# Patient Record
Sex: Male | Born: 2000 | Race: Black or African American | Hispanic: No | Marital: Single | Smoking: Never smoker
Health system: Southern US, Community
[De-identification: ages and names within clinical notes are randomized; demographics above are authoritative.]

## PROBLEM LIST (undated history)

## (undated) DIAGNOSIS — T1490XA Injury, unspecified, initial encounter: Secondary | ICD-10-CM

## (undated) HISTORY — PX: TONSILLECTOMY: SUR1361

---
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
## 2022-02-09 DIAGNOSIS — J302 Other seasonal allergic rhinitis: Secondary | ICD-10-CM | POA: Insufficient documentation

## 2022-02-09 DIAGNOSIS — Z91018 Allergy to other foods: Secondary | ICD-10-CM | POA: Insufficient documentation

## 2022-08-21 ENCOUNTER — Other Ambulatory Visit: Payer: Self-pay

## 2022-08-21 ENCOUNTER — Encounter: Payer: Self-pay | Admitting: Emergency Medicine

## 2022-08-21 ENCOUNTER — Ambulatory Visit (INDEPENDENT_AMBULATORY_CARE_PROVIDER_SITE_OTHER): Payer: Medicaid Other

## 2022-08-21 ENCOUNTER — Ambulatory Visit
Admission: EM | Admit: 2022-08-21 | Discharge: 2022-08-21 | Disposition: A | Discharge status: Home / Self Care | Payer: Medicaid Other | Attending: Internal Medicine | Admitting: Internal Medicine

## 2022-08-21 DIAGNOSIS — Z20822 Contact with and (suspected) exposure to covid-19: Secondary | ICD-10-CM

## 2022-08-21 DIAGNOSIS — J111 Influenza due to unidentified influenza virus with other respiratory manifestations: Secondary | ICD-10-CM

## 2022-08-21 DIAGNOSIS — J4521 Mild intermittent asthma with (acute) exacerbation: Secondary | ICD-10-CM

## 2022-08-21 DIAGNOSIS — R0789 Other chest pain: Secondary | ICD-10-CM | POA: Diagnosis not present

## 2022-08-21 DIAGNOSIS — R000 Tachycardia, unspecified: Secondary | ICD-10-CM

## 2022-08-21 MED ORDER — ALBUTEROL SULFATE HFA 108 (90 BASE) MCG/ACT IN AERS: 1.0000 | INHALATION_SPRAY | Freq: Four times a day (QID) | RESPIRATORY_TRACT | 0 refills | Status: AC | PRN

## 2022-08-21 MED ORDER — PREDNISONE 10 MG (21) PO TBPK: ORAL_TABLET | Freq: Every day | ORAL | 0 refills | Status: AC

## 2022-08-21 MED ORDER — SODIUM CHLORIDE 0.9 % IV BOLUS
1000.0000 mL | Freq: Once | INTRAVENOUS | Status: AC
  Administered 2022-08-21: 1000 mL via INTRAVENOUS

## 2022-08-21 MED ORDER — ACETAMINOPHEN 325 MG PO TABS
650.0000 mg | ORAL_TABLET | Freq: Once | ORAL | Status: AC
  Administered 2022-08-21: 650 mg via ORAL

## 2022-08-21 MED ORDER — OSELTAMIVIR PHOSPHATE 75 MG PO CAPS: 75.0000 mg | ORAL_CAPSULE | Freq: Two times a day (BID) | ORAL | 0 refills | Status: AC

## 2022-08-21 NOTE — ED Triage Notes (Signed)
Pt said last night he began to vomit and shiver. Pt said he has generalized body aches. Not wanting to eat or drink. Very lethargic. Low grade temp 99.5. Pt said his arms and face are tingling.

## 2022-08-21 NOTE — Discharge Instructions (Signed)
I have prescribed you Tamiflu, prednisone, and refilled your albuterol inhaler.  Please start taking these immediately.  Ensure adequate fluid hydration with clear oral fluids including water, Gatorade, or Pedialyte.  I recommend that you get a pulse oximeter from the pharmacy when you pick up your medications and monitor your oxygen and heart rate very diligently.  If your oxygen continues to be below 93% consistently or if your heart rate continues above 120s consistently, please go straight to the emergency department.  If you start to feel worse, please go straight to the emergency department.  Blood work and COVID test are pending.  We will call if they are abnormal.

## 2022-08-21 NOTE — ED Provider Notes (Addendum)
[facility]    CSN: [id number] Arrival date & time: 08/21/22  1147      History   Chief Complaint Chief Complaint  Patient presents with   Fever   Emesis   Generalized Body Aches    HPI Charles Franklin is a 22 y.o. male.   Patient presents with nausea, vomiting, generalized bodyaches, chills, nasal congestion, cough that started last night.  Also reports that his hands are tingly.  Denies chest pain, shortness of breath, diarrhea, abdominal pain.  Denies any sick contacts or fever at home.  Patient reports history of asthma but has not used albuterol inhaler since symptoms started.   Fever Emesis   Past Medical History:  Diagnosis Date   Bone injury     Patient Active Problem List   Diagnosis Date Noted   Moderate persistent asthma without complication 03/10/2018   Severe persistent asthma 04/14/2015   Allergic rhinitis 04/14/2015   Anaphylactic shock due to adverse food reaction 04/14/2015    Past Surgical History:  Procedure Laterality Date   TONSILLECTOMY     WISDOM TOOTH EXTRACTION  02/2016       Home Medications    Prior to Admission medications   Medication Sig Start Date End Date Taking? Authorizing Provider  albuterol (VENTOLIN HFA) 108 (90 Base) MCG/ACT inhaler Inhale 1-2 puffs into the lungs every 6 (six) hours as needed for wheezing or shortness of breath. 08/21/22  Yes [place] Labo, Acie Fredrickson, FNP  oseltamivir (TAMIFLU) 75 MG capsule Take 1 capsule (75 mg total) by mouth every 12 (twelve) hours. 08/21/22  Yes Tanya Marvin, Rolly Salter E, FNP  predniSONE (STERAPRED UNI-PAK 21 TAB) 10 MG (21) TBPK tablet Take by mouth daily. Take 6 tabs by mouth daily  for 2 days, then 5 tabs for 2 days, then 4 tabs for 2 days, then 3 tabs for 2 days, 2 tabs for 2 days, then 1 tab by mouth daily for 2 days 08/21/22  Yes Shantale Holtmeyer, Acie Fredrickson, FNP  albuterol (PROAIR HFA) 108 (90 Base) MCG/ACT inhaler Inhale 2 puffs into the lungs every 4 (four) hours as needed for wheezing or shortness  of breath. 03/10/18   Ambs, Norvel Richards, FNP  cetirizine (ZYRTEC) 10 MG tablet Take 1 tablet daily if needed for runny nose or itching. 03/10/18   Ambs, Norvel Richards, FNP  EPINEPHrine (EPIPEN 2-PAK) 0.3 mg/0.3 mL IJ SOAJ injection Use as directed for severe allergic reaction 03/10/18   Ambs, Norvel Richards, FNP  fluticasone (FLONASE) 50 MCG/ACT nasal spray 2 sprays per nostril once a day as needed for stuffy nose. 03/10/18   Hetty Blend, FNP  fluticasone (FLOVENT HFA) 110 MCG/ACT inhaler 2 puffs twice daily to prevent coughing or wheezing. 03/10/18   Hetty Blend, FNP  hydrocortisone 2.5 % lotion Apply topically as needed. Patient not taking: Reported on 03/10/2018 09/10/16   Bobbitt, Heywood Iles, MD  omalizumab Geoffry Paradise) 150 MG injection INJECT 375MG  SUBCUTANEOUSLY EVERY 2 WEEKS. 06/03/18   06/05/18, MD  Water For Injection Sterile (STERILE WATER, PRESERVATIVE FREE,) injection Use as directed for reconstitution 06/03/18   06/05/18, MD    Family History History reviewed. No pertinent family history.  Social History Social History   Tobacco Use   Smoking status: Never   Smokeless tobacco: Never  Vaping Use   Vaping Use: Never used  Substance Use Topics   Alcohol use: No   Drug use: No     Allergies   Fish allergy, Peanut-containing drug  products, Peanuts [peanut oil], and Shellfish allergy   Review of Systems Review of Systems Per HPI  Physical Exam Triage Vital Signs ED Triage Vitals  Enc Vitals Group     BP 08/21/22 1247 102/63     Pulse Rate 08/21/22 1247 (!) 125     Resp 08/21/22 1247 (!) 22     Temp 08/21/22 1247 99.5 F (37.5 C)     Temp src --      SpO2 08/21/22 1247 98 %     Weight --      Height --      Head Circumference --      Peak Flow --      Pain Score 08/21/22 1245 8     Pain Loc --      Pain Edu? --      Excl. in [place]? --    No data found.  Updated Vital Signs BP 102/63 (BP Location: Left Arm)   Pulse (!) 136   Temp 99.1 F (37.3 C) (Oral)   Resp  (!) 22   SpO2 98%   Visual Acuity Right Eye Distance:   Left Eye Distance:   Bilateral Distance:    Right Eye Near:   Left Eye Near:    Bilateral Near:     Physical Exam Constitutional:      General: He is not in acute distress.    Appearance: Normal appearance. He is ill-appearing. He is not toxic-appearing or diaphoretic.  HENT:     Head: Normocephalic and atraumatic.     Right Ear: Tympanic membrane and ear canal normal.     Left Ear: Tympanic membrane and ear canal normal.     Nose: Congestion present.     Mouth/Throat:     Mouth: Mucous membranes are moist.     Pharynx: No posterior oropharyngeal erythema.  Eyes:     Extraocular Movements: Extraocular movements intact.     Conjunctiva/sclera: Conjunctivae normal.     Pupils: Pupils are equal, round, and reactive to light.  Cardiovascular:     Rate and Rhythm: Regular rhythm. Tachycardia present.     Pulses: Normal pulses.     Heart sounds: Normal heart sounds.  Pulmonary:     Effort: Pulmonary effort is normal. No respiratory distress.     Breath sounds: No stridor. Wheezing present. No rhonchi or rales.     Comments: Mild wheezing bilaterally. Abdominal:     General: Abdomen is flat. Bowel sounds are normal. There is no distension.     Palpations: Abdomen is soft.     Tenderness: There is no abdominal tenderness.  Musculoskeletal:        General: Normal range of motion.     Cervical back: Normal range of motion.  Skin:    General: Skin is warm and dry.  Neurological:     General: No focal deficit present.     Mental Status: He is alert and oriented to person, place, and time. Mental status is at baseline.     Cranial Nerves: Cranial nerves 2-12 are intact.     Sensory: Sensation is intact.     Motor: Motor function is intact.     Coordination: Coordination is intact.     Gait: Gait is intact.  Psychiatric:        Mood and Affect: Mood normal.        Behavior: Behavior normal.      UC Treatments /  Results  Labs (all labs ordered are listed,  but only abnormal results are displayed) Labs Reviewed  SARS CORONAVIRUS 2 (TAT 6-24 HRS)  CBC  BASIC METABOLIC PANEL    EKG   Radiology DG Chest 2 View  Result Date: 08/21/2022 CLINICAL DATA:  Chest discomfort. EXAM: CHEST - 2 VIEW COMPARISON:  June 20, 2009. FINDINGS: The heart size and mediastinal contours are within normal limits. Both lungs are clear. The visualized skeletal structures are unremarkable. IMPRESSION: No active cardiopulmonary disease. Electronically Signed   By: Marijo Conception M.D.   On: 08/21/2022 13:13    Procedures Procedures (including critical care time)  Medications Ordered in UC Medications  acetaminophen (TYLENOL) tablet 650 mg (650 mg Oral Given 08/21/22 1317)  sodium chloride 0.9 % bolus 1,000 mL (0 mLs Intravenous Stopped 08/21/22 1544)    Initial Impression / Assessment and Plan / UC Course  I have reviewed the triage vital signs and the nursing notes.  Pertinent labs & imaging results that were available during my care of the patient were reviewed by me and considered in my medical decision making (see chart for details).     Due to appearance on physical exam and tachycardia, patient was advised that he will need to go to the hospital for further evaluation and management given limited resources here in urgent care.  Patient was adamant that he did not want to go to the hospital.  Discussed risk of not going to the hospital with patient.  Patient voiced understanding and accepted risks.  Given patient is refusing going to the hospital, will perform limited evaluation here in urgent care.  Tylenol administered in urgent care to see if fever and heart rate would decrease.  There was no change.  Chest x-ray was completed that was negative for any acute cardiopulmonary process.  Given heart rate did not improve with Tylenol administration, 1 bolus of IV fluids was administered.  Heart rate did not improve and  sustained at 130s.  Also, oxygen was 98% on initial triage and arrival to the urgent care and decreased to 90 to 93% while in urgent care.  Although, patient was lying flat for most of the visit which could be contributing.  When he sits up, oxygen will increase.  Suspect this is due to a mild wheezing noted on exam.  Albuterol deferred given significant tachycardia and concern for heart rate instability in urgent care.  No acute tachypnea noted.  Patient was again advised that he will need to go to the hospital for further evaluation and management given no improvement of symptoms with several different interventions and oxygen decreasing.  He was still adamant that he did not want to go to the hospital.  Risks again were discussed with patient about going to the hospital.  He voiced understanding and accepted risks.  Given the patient does not want a go to the hospital, will provide limited treatment here in urgent care.  I am highly suspicious the patient has influenza but did not have the ability to test for flu.  COVID test is pending.  Will opt to treat with Tamiflu.  It also appears the patient could be having an asthma exacerbation so prednisone steroid taper was prescribed.  He reports that he has taken this before and tolerated well.  Patient advised to push fluids and rest.  He was advised to get a pulse oximeter from the pharmacy and monitor heart rate and oxygen.  He was advised to go straight to the ER if symptoms persist or worsen or  if his oxygen or heart rate continue to be abnormal.  He states that he feels much better since arrival to the urgent care which is reassuring.  Advised patient it will be beneficial to come back to urgent care tomorrow to have recheck of vitals. He was agreeable. Patient was given strict return and ER precautions.  Patient verbalized understanding and was agreeable with plan. Oxygen increased to 95 and sustained prior to discharge. No other adventitous lung sounds  noted.   Coding this is a level 4 given multiple interventions, acuity of patient, and time spent with patient. Final Clinical Impressions(s) / UC Diagnoses   Final diagnoses:  Influenza  Encounter for laboratory testing for COVID-19 virus  Tachycardia  Mild intermittent asthma with acute exacerbation     Discharge Instructions      I have prescribed you Tamiflu, prednisone, and refilled your albuterol inhaler.  Please start taking these immediately.  Ensure adequate fluid hydration with clear oral fluids including water, Gatorade, or Pedialyte.  I recommend that you get a pulse oximeter from the pharmacy when you pick up your medications and monitor your oxygen and heart rate very diligently.  If your oxygen continues to be below 93% consistently or if your heart rate continues above 120s consistently, please go straight to the emergency department.  If you start to feel worse, please go straight to the emergency department.  Blood work and COVID test are pending.  We will call if they are abnormal.     ED Prescriptions     Medication Sig Dispense Auth. Provider   oseltamivir (TAMIFLU) 75 MG capsule Take 1 capsule (75 mg total) by mouth every 12 (twelve) hours. 10 capsule [place], [place] E, [place]   predniSONE (STERAPRED UNI-PAK 21 TAB) 10 MG (21) TBPK tablet Take by mouth daily. Take 6 tabs by mouth daily  for 2 days, then 5 tabs for 2 days, then 4 tabs for 2 days, then 3 tabs for 2 days, 2 tabs for 2 days, then 1 tab by mouth daily for 2 days 42 tablet Terianna Peggs, Hildred Alamin E, FNP   albuterol (VENTOLIN HFA) 108 (90 Base) MCG/ACT inhaler Inhale 1-2 puffs into the lungs every 6 (six) hours as needed for wheezing or shortness of breath. 1 each Teodora Medici, [place]      PDMP not reviewed this encounter.   Teodora Medici, [place] 08/21/22 [place], [place], [place] 08/21/22 [place], [place], FNP 08/21/22 [place], [place],  08/21/22 1651

## 2022-08-22 LAB — CBC

## 2022-08-22 LAB — SARS CORONAVIRUS 2 (TAT 6-24 HRS): SARS Coronavirus 2: NEGATIVE

## 2022-08-24 LAB — BASIC METABOLIC PANEL
BUN/Creatinine Ratio: 11 (ref 9–20)
BUN: 11 mg/dL (ref 6–20)
CO2: 18 mmol/L — ABNORMAL LOW (ref 20–29)
Calcium: 8.4 mg/dL — ABNORMAL LOW (ref 8.7–10.2)
Chloride: 106 mmol/L (ref 96–106)
Creatinine, Ser: 1.01 mg/dL (ref 0.76–1.27)
Glucose: 82 mg/dL (ref 70–99)
Potassium: 3.9 mmol/L (ref 3.5–5.2)
Sodium: 139 mmol/L (ref 134–144)
eGFR: 109 mL/min/{1.73_m2} (ref >59)

## 2022-09-17 ENCOUNTER — Telehealth: Payer: Self-pay | Admitting: Internal Medicine

## 2022-09-17 NOTE — Telephone Encounter (Signed)
Patient's CBC from visit from 08/21/2022 had never resulted in inbox.  Spoke with Valetta Fuller, RN who advised that it was originally canceled due to sample not being found but then was sent to [facility] to be ran.  I still never received the result.  Called [facility] today to inquire about result, and they faxed over result. Result in process to be scanned into chart.  There are no obvious significant abnormalities in blood work.  Patient was advised to follow-up with family doctor to have repeat blood work.  He advised that he does have a family doctor and will call them today to schedule an appointment.  All questions answered.

## 2022-11-22 DIAGNOSIS — J4520 Mild intermittent asthma, uncomplicated: Secondary | ICD-10-CM | POA: Insufficient documentation

## 2023-05-01 ENCOUNTER — Ambulatory Visit: Payer: Self-pay

## 2023-05-01 DIAGNOSIS — U071 COVID-19: Secondary | ICD-10-CM | POA: Insufficient documentation

## 2023-05-01 DIAGNOSIS — R062 Wheezing: Secondary | ICD-10-CM | POA: Insufficient documentation

## 2023-05-01 DIAGNOSIS — J029 Acute pharyngitis, unspecified: Secondary | ICD-10-CM | POA: Insufficient documentation

## 2023-11-17 ENCOUNTER — Emergency Department (HOSPITAL_BASED_OUTPATIENT_CLINIC_OR_DEPARTMENT_OTHER)
Admission: EM | Admit: 2023-11-17 | Discharge: 2023-11-18 | Disposition: A | Discharge status: Home / Self Care | Attending: Emergency Medicine | Admitting: Emergency Medicine

## 2023-11-17 ENCOUNTER — Encounter (HOSPITAL_BASED_OUTPATIENT_CLINIC_OR_DEPARTMENT_OTHER): Payer: Self-pay | Admitting: Emergency Medicine

## 2023-11-17 DIAGNOSIS — R112 Nausea with vomiting, unspecified: Secondary | ICD-10-CM | POA: Insufficient documentation

## 2023-11-17 DIAGNOSIS — Z91010 Allergy to peanuts: Secondary | ICD-10-CM | POA: Insufficient documentation

## 2023-11-17 DIAGNOSIS — J45909 Unspecified asthma, uncomplicated: Secondary | ICD-10-CM | POA: Insufficient documentation

## 2023-11-17 DIAGNOSIS — R197 Diarrhea, unspecified: Secondary | ICD-10-CM | POA: Diagnosis not present

## 2023-11-17 DIAGNOSIS — K529 Noninfective gastroenteritis and colitis, unspecified: Secondary | ICD-10-CM

## 2023-11-17 MED ORDER — SODIUM CHLORIDE 0.9 % IV BOLUS
1000.0000 mL | Freq: Once | INTRAVENOUS | Status: AC
  Administered 2023-11-17: 1000 mL via INTRAVENOUS

## 2023-11-17 MED ORDER — KETOROLAC TROMETHAMINE 30 MG/ML IJ SOLN
30.0000 mg | Freq: Once | INTRAMUSCULAR | Status: AC
  Administered 2023-11-17: 30 mg via INTRAVENOUS
  Filled 2023-11-17: qty 1

## 2023-11-17 MED ORDER — ONDANSETRON HCL 4 MG/2ML IJ SOLN
4.0000 mg | Freq: Once | INTRAMUSCULAR | Status: AC
  Administered 2023-11-17: 4 mg via INTRAVENOUS
  Filled 2023-11-17: qty 2

## 2023-11-17 NOTE — ED Provider Notes (Signed)
  EMERGENCY DEPARTMENT AT [facility] Provider Note   CSN: [id number] Arrival date & time: 11/17/23  2146     History  Chief Complaint  Patient presents with   Emesis    Charles Franklin is a 23 y.o. male.  Patient is a 23 year old male with history of asthma.  Patient presenting today for evaluation of nausea and vomiting.  This started suddenly this evening.  He does describe some loose stool, but no bloody vomit or stool.  No fevers or chills.  He does report his child ill this week in a similar fashion.  He denies abdominal pain.  No fevers or chills.  No alleviating factors.       Home Medications Prior to Admission medications   Medication Sig Start Date End Date Taking? Authorizing Provider  albuterol (PROAIR HFA) 108 (90 Base) MCG/ACT inhaler Inhale 2 puffs into the lungs every 4 (four) hours as needed for wheezing or shortness of breath. 03/10/18   Ambs, Norvel Richards, FNP  albuterol (VENTOLIN HFA) 108 (90 Base) MCG/ACT inhaler Inhale 1-2 puffs into the lungs every 6 (six) hours as needed for wheezing or shortness of breath. 08/21/22   Gustavus Bryant, FNP  cetirizine (ZYRTEC) 10 MG tablet Take 1 tablet daily if needed for runny nose or itching. 03/10/18   Ambs, Norvel Richards, FNP  EPINEPHrine (EPIPEN 2-PAK) 0.3 mg/0.3 mL IJ SOAJ injection Use as directed for severe allergic reaction 03/10/18   Ambs, Norvel Richards, FNP  fluticasone (FLONASE) 50 MCG/ACT nasal spray 2 sprays per nostril once a day as needed for stuffy nose. 03/10/18   Hetty Blend, FNP  fluticasone (FLOVENT HFA) 110 MCG/ACT inhaler 2 puffs twice daily to prevent coughing or wheezing. 03/10/18   Hetty Blend, FNP  hydrocortisone 2.5 % lotion Apply topically as needed. Patient not taking: Reported on 03/10/2018 09/10/16   Bobbitt, Heywood Iles, MD  methylPREDNISolone (MEDROL DOSEPAK) 4 MG TBPK tablet Take 4 mg by mouth as directed. 05/01/23   [provider]  nirmatrelvir & ritonavir (PAXLOVID, 300/100,) 20 x 150  MG & 10 x 100MG  TBPK as directed. 05/01/23   [provider]  omalizumab Geoffry Paradise) 150 MG injection INJECT 375MG  SUBCUTANEOUSLY EVERY 2 WEEKS. 06/03/18   Fletcher Anon, MD  oseltamivir (TAMIFLU) 75 MG capsule Take 1 capsule (75 mg total) by mouth every 12 (twelve) hours. 08/21/22   Gustavus Bryant, FNP  predniSONE (STERAPRED UNI-PAK 21 TAB) 10 MG (21) TBPK tablet Take by mouth daily. Take 6 tabs by mouth daily  for 2 days, then 5 tabs for 2 days, then 4 tabs for 2 days, then 3 tabs for 2 days, 2 tabs for 2 days, then 1 tab by mouth daily for 2 days 08/21/22   Gustavus Bryant, FNP  Water For Injection Sterile (STERILE WATER, PRESERVATIVE FREE,) injection Use as directed for reconstitution 06/03/18   Fletcher Anon, MD      Allergies    Fish allergy, Other, Peanut-containing drug products, Peanuts [peanut oil], Shellfish allergy, and Shellfish-derived products    Review of Systems   Review of Systems  All other systems reviewed and are negative.   Physical Exam Updated Vital Signs BP 116/75 (BP Location: Right Arm)   Pulse 60   Temp 97.6 F (36.4 C)   Resp 19   SpO2 100%  Physical Exam Vitals and nursing note reviewed.  Constitutional:      General: He is not in acute distress.  Appearance: He is well-developed. He is not diaphoretic.  HENT:     Head: Normocephalic and atraumatic.  Cardiovascular:     Rate and Rhythm: Normal rate and regular rhythm.     Heart sounds: No murmur heard.    No friction rub.  Pulmonary:     Effort: Pulmonary effort is normal. No respiratory distress.     Breath sounds: Normal breath sounds. No wheezing or rales.  Abdominal:     General: Bowel sounds are normal. There is no distension.     Palpations: Abdomen is soft.     Tenderness: There is no abdominal tenderness.  Musculoskeletal:        General: Normal range of motion.     Cervical back: Normal range of motion and neck supple.  Skin:    General: Skin is warm and dry.  Neurological:      Mental Status: He is alert and oriented to person, place, and time.     Coordination: Coordination normal.     ED Results / Procedures / Treatments   Labs (all labs ordered are listed, but only abnormal results are displayed) Labs Reviewed  COMPREHENSIVE METABOLIC PANEL WITH GFR  LIPASE, BLOOD  CBC WITH DIFFERENTIAL/PLATELET    EKG None  Radiology No results found.  Procedures Procedures  {Document cardiac monitor, telemetry assessment procedure when appropriate:1}  Medications Ordered in ED Medications  sodium chloride 0.9 % bolus 1,000 mL (has no administration in time range)  ondansetron (ZOFRAN) injection 4 mg (has no administration in time range)  ketorolac (TORADOL) 30 MG/ML injection 30 mg (has no administration in time range)    ED Course/ Medical Decision Making/ A&P   {   Click here for ABCD2, HEART and other calculatorsREFRESH Note before signing :1}                              Medical Decision Making Risk Prescription drug management.   ***  {Document critical care time when appropriate:1} {Document review of labs and clinical decision tools ie heart score, Chads2Vasc2 etc:1}  {Document your independent review of radiology images, and any outside records:1} {Document your discussion with family members, caretakers, and with consultants:1} {Document social determinants of health affecting pt's care:1} {Document your decision making why or why not admission, treatments were needed:1} Final Clinical Impression(s) / ED Diagnoses Final diagnoses:  None    Rx / DC Orders ED Discharge Orders     None

## 2023-11-17 NOTE — ED Triage Notes (Signed)
 Vomiting/ diarrhea started around 8pm Daughter recently seen for a stomach bug headache

## 2023-11-18 LAB — COMPREHENSIVE METABOLIC PANEL WITH GFR
ALT: 28 U/L (ref 0–44)
AST: 21 U/L (ref 15–41)
Albumin: 4 g/dL (ref 3.5–5.0)
Alkaline Phosphatase: 50 U/L (ref 38–126)
Anion gap: 8 (ref 5–15)
BUN: 13 mg/dL (ref 6–20)
CO2: 24 mmol/L (ref 22–32)
Calcium: 7.9 mg/dL — ABNORMAL LOW (ref 8.9–10.3)
Chloride: 110 mmol/L (ref 98–111)
Creatinine, Ser: 0.8 mg/dL (ref 0.61–1.24)
GFR, Estimated: >60 mL/min (ref >60)
Glucose, Bld: 101 mg/dL — ABNORMAL HIGH (ref 70–99)
Potassium: 3.6 mmol/L (ref 3.5–5.1)
Sodium: 142 mmol/L (ref 135–145)
Total Bilirubin: 0.7 mg/dL (ref 0.0–1.2)
Total Protein: 6.4 g/dL — ABNORMAL LOW (ref 6.5–8.1)

## 2023-11-18 LAB — CBC WITH DIFFERENTIAL/PLATELET
Abs Immature Granulocytes: 0.02 10*3/uL (ref 0.00–0.07)
Basophils Absolute: 0 10*3/uL (ref 0.0–0.1)
Basophils Relative: 0 %
Eosinophils Absolute: 0.1 10*3/uL (ref 0.0–0.5)
Eosinophils Relative: 1 %
HCT: 39 % (ref 39.0–52.0)
Hemoglobin: 12.9 g/dL — ABNORMAL LOW (ref 13.0–17.0)
Immature Granulocytes: 0 %
Lymphocytes Relative: 8 %
Lymphs Abs: 0.7 10*3/uL (ref 0.7–4.0)
MCH: 32.7 pg (ref 26.0–34.0)
MCHC: 33.1 g/dL (ref 30.0–36.0)
MCV: 99 fL (ref 80.0–100.0)
Monocytes Absolute: 0.5 10*3/uL (ref 0.1–1.0)
Monocytes Relative: 6 %
Neutro Abs: 7.9 10*3/uL — ABNORMAL HIGH (ref 1.7–7.7)
Neutrophils Relative %: 85 %
Platelets: 280 10*3/uL (ref 150–400)
RBC: 3.94 MIL/uL — ABNORMAL LOW (ref 4.22–5.81)
RDW: 12.4 % (ref 11.5–15.5)
WBC: 9.2 10*3/uL (ref 4.0–10.5)
nRBC: 0 % (ref 0.0–0.2)

## 2023-11-18 LAB — LIPASE, BLOOD: Lipase: 24 U/L (ref 11–51)

## 2023-11-18 MED ORDER — ONDANSETRON 8 MG PO TBDP: ORAL_TABLET | ORAL | 0 refills | Status: DC

## 2023-11-18 MED ORDER — ONDANSETRON 8 MG PO TBDP: ORAL_TABLET | ORAL | 0 refills | Status: AC

## 2023-11-18 NOTE — Discharge Instructions (Signed)
 Begin taking Zofran as prescribed as needed for nausea.  Clear liquid diet for the next 12 hours, then slowly advance to normal as tolerated.  Return to the ER if your symptoms significantly worsen or change.
